# Patient Record
Sex: Male | Born: 1955 | Race: White | Hispanic: No | Smoking: Former smoker
Health system: Southern US, Community
[De-identification: ages and names within clinical notes are randomized; demographics above are authoritative.]

## PROBLEM LIST (undated history)

## (undated) DIAGNOSIS — K7460 Unspecified cirrhosis of liver: Secondary | ICD-10-CM

## (undated) DIAGNOSIS — N19 Unspecified kidney failure: Secondary | ICD-10-CM

## (undated) HISTORY — DX: Unspecified cirrhosis of liver: K74.60

## (undated) HISTORY — DX: Unspecified kidney failure: N19

---

## 2012-11-12 ENCOUNTER — Telehealth: Payer: Self-pay

## 2012-11-12 NOTE — Telephone Encounter (Addendum)
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 He has alcoholic cirrhosis and is moving to [place] from [place]. Can you contact his sister Gigi Gin at [phone] to help coordinate NGI appt with me. Also can you ask about previous records and try to start gettting them sent here. Thanks        I spoke with the pt's sister and scheduled appt for 12/14/12, she asked if he can be seen sooner.  Do you want me to double book?

## 2012-11-13 NOTE — Telephone Encounter (Signed)
Peggy called back. I gave her appointment date and time Peggy verbalized understanding and will have patient here

## 2012-11-13 NOTE — Telephone Encounter (Signed)
Dr. Christella Hartigan had an opening on 11-20-2012 @ 9 am---Moved patient to that spot  LM on Peggy's VM for call back to the office

## 2012-11-13 NOTE — Telephone Encounter (Signed)
Yes, double book is ok,  thanks

## 2012-11-20 ENCOUNTER — Other Ambulatory Visit (INDEPENDENT_AMBULATORY_CARE_PROVIDER_SITE_OTHER): Payer: Self-pay

## 2012-11-20 ENCOUNTER — Encounter: Payer: Self-pay | Admitting: Gastroenterology

## 2012-11-20 ENCOUNTER — Ambulatory Visit (INDEPENDENT_AMBULATORY_CARE_PROVIDER_SITE_OTHER): Payer: Self-pay | Admitting: Gastroenterology

## 2012-11-20 VITALS — BP 88/66 | HR 60 | Ht 71.0 in | Wt 148.6 lb

## 2012-11-20 DIAGNOSIS — R188 Other ascites: Secondary | ICD-10-CM

## 2012-11-20 DIAGNOSIS — K7030 Alcoholic cirrhosis of liver without ascites: Secondary | ICD-10-CM

## 2012-11-20 LAB — COMPREHENSIVE METABOLIC PANEL
AST: 47 U/L — ABNORMAL HIGH (ref 0–37)
Alkaline Phosphatase: 64 U/L (ref 39–117)
BUN: 69 mg/dL — ABNORMAL HIGH (ref 6–23)
CO2: 17 mEq/L — ABNORMAL LOW (ref 19–32)
Chloride: 94 mEq/L — ABNORMAL LOW (ref 96–112)
GFR: 17.15 mL/min — ABNORMAL LOW (ref >60.00)
Glucose, Bld: 82 mg/dL (ref 70–99)
Total Bilirubin: 0.9 mg/dL (ref 0.3–1.2)
Total Protein: 6.4 g/dL (ref 6.0–8.3)

## 2012-11-20 LAB — CBC WITH DIFFERENTIAL/PLATELET
Basophils Relative: 0.4 % (ref 0.0–3.0)
Eosinophils Relative: 2.5 % (ref 0.0–5.0)
HCT: 32.3 % — ABNORMAL LOW (ref 39.0–52.0)
Hemoglobin: 10.9 g/dL — ABNORMAL LOW (ref 13.0–17.0)
Lymphs Abs: 0.7 10*3/uL (ref 0.7–4.0)
MCV: 93.3 fl (ref 78.0–100.0)
Monocytes Absolute: 0.6 10*3/uL (ref 0.1–1.0)
Monocytes Relative: 7.2 % (ref 3.0–12.0)
Neutro Abs: 6.3 10*3/uL (ref 1.4–7.7)
RBC: 3.47 Mil/uL — ABNORMAL LOW (ref 4.22–5.81)
WBC: 7.7 10*3/uL (ref 4.5–10.5)

## 2012-11-20 LAB — PROTIME-INR
INR: 1.1 ratio — ABNORMAL HIGH (ref 0.8–1.0)
Prothrombin Time: 11.4 s (ref 10.2–12.4)

## 2012-11-20 LAB — HEPATITIS B SURFACE ANTIGEN: Hepatitis B Surface Ag: NEGATIVE

## 2012-11-20 LAB — HEPATITIS C ANTIBODY: HCV Ab: NEGATIVE

## 2012-11-20 LAB — FERRITIN: Ferritin: 775.6 ng/mL — ABNORMAL HIGH (ref 22.0–322.0)

## 2012-11-20 LAB — IBC PANEL: Saturation Ratios: 14.3 % — ABNORMAL LOW (ref 20.0–50.0)

## 2012-11-20 NOTE — Patient Instructions (Addendum)
You will have labs checked today in the basement lab.  Please head down after you check out with the front desk  (cmet, cbc, inr, AFP, Hepatitis A (IgM and IgG), Hepatitis B surface antigen, Hepatitis B surface antibody, Hepatitis C antibody, total iron, ferritin, TIBC, ANA, AMA). You need to begin to apply for medicaid, obamacare etc.  We will help point you in right direction for that but the work will mainly be yours. You need to get in touch with [facility] to help with financial aid, guidance. Large volume paracentesis with same time albumin infusion (ultrasound guided), 50 gm of albumin.  Do not remove more than 7-8 liters. Fluid needs to be sent for cell count, albumin, total protein, cytology, gram stain, culture).  Please arrive at [facility] on 11/23/12 at 215 pm.   I will probably be increasing your diuretic pills, will decide this after seeing you lab results. It is important that you have a relatively low salt diet.  High salt diet can cause fluid to accumulate in your legs, abdomen and even around your lungs. You should try to avoid NSAID type over the counter pain medicines as best as possible. Tylenol is safe to take for 'routine' aches and pains, but never take more than 1/2 the dose suggested on the package instructions (never more than 2 grams per day). Avoid alcohol. Please return to see Dr. Christella Hartigan in 3-4 weeks, sooner if needed.

## 2012-11-20 NOTE — Progress Notes (Signed)
HPI: This is a   very pleasant 57 year old man whom I am meeting for the first time today.  His sister is Clinical cytogeneticist, who is a Garment/textile technologist at [employer].  Alcoholic for 20-30 years.  His last drink in July 2014.  Not sure if he has been tested for hep viruses.  No hematemesis,  He did have an EGD about a year ago in [place]; was told he had an ulcer.  Was having melenic stools.  He doesn't recall exactly where it was done or who did it. Does not recall any imaging his liver.  HAs been getting paracentesis LVP every 2-3 weeks (has had about 10 of them.  He thinks they usually remove 10-12 liters during the paracentesis.  The last one was about 2 weeks ago.  He is on diuretics.  Never did other illicit drugs.  He brought with him limited records from [facility]. There is one progress note attached which describes the hospital stay this past August. He apparently was presented with unresponsiveness, altered mental status. He required intubation. Total bilirubin was at least 1.7 on one occasion. He underwent a 13 L paracentesis. "Do to overall decline hospice was consulted and he was transferred to hospice"  He has since then recovered obviously somewhat. He moved here and is a resident of a skilled nursing facility now.  Review of systems: Pertinent positive and negative review of systems were noted in the above HPI section. Complete review of systems was performed and was otherwise normal.    Past Medical History  Diagnosis Date  . Cirrhosis     History reviewed. No pertinent past surgical history.  Current Outpatient Prescriptions  Medication Sig Dispense Refill  . furosemide (LASIX) 20 MG tablet Take 20 mg by mouth daily.      . haloperidol (HALDOL) 1 MG tablet Take 1 mg by mouth 4 (four) times daily as needed.      . lactulose, encephalopathy, (CHRONULAC) 10 GM/15ML SOLN Take 10 g by mouth at bedtime and may repeat dose one time if needed.      Marland Kitchen LORazepam (ATIVAN)  0.5 MG tablet Take 0.5 mg by mouth every 8 (eight) hours.      . pentoxifylline (TRENTAL) 400 MG CR tablet Take 400 mg by mouth 3 (three) times daily with meals.      . rifaximin (XIFAXAN) 550 MG TABS tablet Take 550 mg by mouth 2 (two) times daily.      Marland Kitchen spironolactone (ALDACTONE) 100 MG tablet Take 100 mg by mouth daily.      Marland Kitchen thiamine (VITAMIN B-1) 100 MG tablet Take 100 mg by mouth 2 (two) times daily.       No current facility-administered medications for this visit.    Allergies as of 11/20/2012  . (No Known Allergies)    Family History  Problem Relation Age of Onset  . Heart disease Father     History   Social History  . Marital Status: Single    Spouse Name: N/A    Number of Children: N/A  . Years of Education: N/A   Occupational History  . Unemployed    Social History Main Topics  . Smoking status: Former Smoker    Types: Cigars  . Smokeless tobacco: Never Used  . Alcohol Use: No  . Drug Use: No  . Sexual Activity: Not on file   Other Topics Concern  . Not on file   Social History Narrative  . No narrative on  file       Physical Exam: BP 88/66  Pulse 60  Ht 5\' 11"  (1.803 m)  Wt 148 lb 9.6 oz (67.405 kg)  BMI 20.73 kg/m2 Constitutional: Chronically ill-appearing Psychiatric: alert and oriented x3 Eyes: extraocular movements intact, anicteric sclera Mouth: oral pharynx moist, no lesions Neck: supple no lymphadenopathy Cardiovascular: heart regular rate and rhythm Lungs: clear to auscultation bilaterally Abdomen: soft, nontender, nondistended, massive ascites, no peritoneal signs, normal bowel sounds Extremities: no lower extremity edema bilaterally, very thin legs Skin: no lesions on visible extremities    Assessment and plan: 57 y.o. male with  cirrhosis, poorly compensated  He is alcoholic, his last drink was about 3 months ago. I suspect that is the etiology of his cirrhosis however like to run some other basic workup check for viral  causes, autoimmune process that could be at into his issue. He will also be immunized for hepatitis A and B. if indicated. His biggest problem right now is that of massive ascites. He has been undergoing periodic paracentesis in [place]. His diuretic dosing is at fairly low dose and I think there is certainly room to increase his diuretics. He is going to get a battery of blood tests today including those mentioned above as well as CBC, complete metabolic profile, alpha-fetoprotein, INR. I will base the increase of his diuretic dosing was on his kidney function and electrolyte status. My hope would be to get him high enough on diuretics that he is not require periodic paracentesis anymore but I am also going to get him set up for a large-volume paracentesis with albumin infusion at the same time, 50 g. He has no health insurance. We have given him paperwork on contacting [facility] financial aid. I made it very clear to him that he will almost certainly require liver transplantation for medium and long term survival and he will need some sort of insurance for that either Medicaid or otherwise. He will return to see me in 3-4 weeks and sooner if needed, I will be in contact with him prior to then certainly.

## 2012-11-22 LAB — HEPATITIS A ANTIBODY, TOTAL: Hep A Total Ab: NEGATIVE

## 2012-11-23 ENCOUNTER — Ambulatory Visit (HOSPITAL_COMMUNITY): Payer: Self-pay

## 2012-11-23 ENCOUNTER — Telehealth: Payer: Self-pay | Admitting: Gastroenterology

## 2012-11-23 ENCOUNTER — Ambulatory Visit (HOSPITAL_COMMUNITY)
Admission: RE | Admit: 2012-11-23 | Discharge: 2012-11-23 | Disposition: A | Discharge status: Home / Self Care | Payer: Self-pay | Source: Ambulatory Visit | Attending: Gastroenterology | Admitting: Gastroenterology

## 2012-11-23 DIAGNOSIS — R188 Other ascites: Secondary | ICD-10-CM | POA: Insufficient documentation

## 2012-11-23 DIAGNOSIS — K7030 Alcoholic cirrhosis of liver without ascites: Secondary | ICD-10-CM

## 2012-11-23 LAB — PROTEIN, BODY FLUID: Total protein, fluid: 0.9 g/dL

## 2012-11-23 LAB — BODY FLUID CELL COUNT WITH DIFFERENTIAL: Total Nucleated Cell Count, Fluid: UNDETERMINED cu mm (ref 0–1000)

## 2012-11-23 LAB — ANA: Anti Nuclear Antibody(ANA): NEGATIVE

## 2012-11-23 LAB — ALBUMIN, FLUID (OTHER)

## 2012-11-23 LAB — MITOCHONDRIAL ANTIBODIES: Mitochondrial M2 Ab, IgG: 0.82 (ref <0.91)

## 2012-11-23 MED ORDER — ALBUMIN HUMAN 25 % IV SOLN
50.0000 g | Freq: Once | INTRAVENOUS | Status: AC
  Administered 2012-11-23: 50 g via INTRAVENOUS
  Filled 2012-11-23: qty 200

## 2012-11-23 NOTE — Procedures (Signed)
Successful US guided paracentesis from RLQ.  Yielded 8L of clear pale yellow fluid.  No immediate complications.  Pt tolerated well.   Specimen was sent for labs. The patient received 50g IV albumin during the procedure as ordered.  Brayton El PA-C 11/23/2012 4:08 PM

## 2012-11-24 ENCOUNTER — Telehealth: Payer: Self-pay | Admitting: Gastroenterology

## 2012-11-24 DIAGNOSIS — K767 Hepatorenal syndrome: Secondary | ICD-10-CM

## 2012-11-24 DIAGNOSIS — K7030 Alcoholic cirrhosis of liver without ascites: Secondary | ICD-10-CM

## 2012-11-24 NOTE — Telephone Encounter (Signed)
Left message on machine to call back  

## 2012-11-24 NOTE — Telephone Encounter (Signed)
Per verbal order from Dr Christella Hartigan pt can not have an order to self administer meds The patient has been notified of this information and all questions answered.

## 2012-11-24 NOTE — Telephone Encounter (Signed)
Tyler Henry, with his Cr so high (3.9) he needs to stop both aldactone and lasix starting today. They clearly aren't working very well anyway given his need for LVP every 2-3 weeks.

## 2012-11-25 NOTE — Telephone Encounter (Signed)
[phone] Message left for [facility] to set up appt Pt to get labs and rov has been scheduled [phone] [facility] needs order to stop aldactone and lasix message left to have her return my call

## 2012-11-25 NOTE — Telephone Encounter (Signed)
Tyler Henry returned call and asked for a faxed order to [phone].  I have faxed the results and the order to stop the aldactone and the lasix

## 2012-11-25 NOTE — Telephone Encounter (Addendum)
Tyler Henry, Please call him. His kidney function is very poor (Cr 3.9) and so we cannot increase his diuretics at all. His T bili is 0.9 and INR 1.1 with normal platelets however. He had LVP yesterday and needs another set of labs on Thursday cmet. 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 He should have ROV with me in next 2-3 weeks. Considering TIPS however he was encephalopathic in [place] to the point of requiring intubation.

## 2012-11-26 ENCOUNTER — Telehealth: Payer: Self-pay

## 2012-11-26 ENCOUNTER — Other Ambulatory Visit (INDEPENDENT_AMBULATORY_CARE_PROVIDER_SITE_OTHER): Payer: Self-pay

## 2012-11-26 DIAGNOSIS — K7030 Alcoholic cirrhosis of liver without ascites: Secondary | ICD-10-CM

## 2012-11-26 LAB — COMPREHENSIVE METABOLIC PANEL
AST: 37 U/L (ref 0–37)
Albumin: 3.7 g/dL (ref 3.5–5.2)
Alkaline Phosphatase: 57 U/L (ref 39–117)
BUN: 50 mg/dL — ABNORMAL HIGH (ref 6–23)
CO2: 19 mEq/L (ref 19–32)
Creatinine, Ser: 2.9 mg/dL — ABNORMAL HIGH (ref 0.4–1.5)
GFR: 23.81 mL/min — ABNORMAL LOW (ref >60.00)
Glucose, Bld: 102 mg/dL — ABNORMAL HIGH (ref 70–99)
Sodium: 128 mEq/L — ABNORMAL LOW (ref 135–145)
Total Bilirubin: 1.3 mg/dL — ABNORMAL HIGH (ref 0.3–1.2)
Total Protein: 6.7 g/dL (ref 6.0–8.3)

## 2012-11-26 NOTE — Telephone Encounter (Signed)
rov with me next week also (double book if needed)  Left message on machine to call back

## 2012-11-27 LAB — BODY FLUID CULTURE
Culture: NO GROWTH
Gram Stain: NONE SEEN

## 2012-11-27 NOTE — Telephone Encounter (Signed)
Left message on machine to call back  

## 2012-11-30 NOTE — Telephone Encounter (Signed)
Pt sister has been contacted and appt has been made, she will call back if she wants the pt to have labs drawn wed morning here instead of [address] [place]

## 2012-12-01 ENCOUNTER — Telehealth: Payer: Self-pay | Admitting: Gastroenterology

## 2012-12-01 NOTE — Telephone Encounter (Signed)
Pt notified he can come in tomorrow for labs prior to appt

## 2012-12-02 ENCOUNTER — Other Ambulatory Visit (INDEPENDENT_AMBULATORY_CARE_PROVIDER_SITE_OTHER): Payer: Self-pay

## 2012-12-02 ENCOUNTER — Ambulatory Visit (INDEPENDENT_AMBULATORY_CARE_PROVIDER_SITE_OTHER): Payer: Self-pay | Admitting: Gastroenterology

## 2012-12-02 ENCOUNTER — Encounter: Payer: Self-pay | Admitting: Gastroenterology

## 2012-12-02 ENCOUNTER — Other Ambulatory Visit: Payer: Self-pay

## 2012-12-02 VITALS — BP 96/70 | HR 104 | Ht 68.0 in | Wt 147.2 lb

## 2012-12-02 DIAGNOSIS — K7460 Unspecified cirrhosis of liver: Secondary | ICD-10-CM

## 2012-12-02 DIAGNOSIS — Z23 Encounter for immunization: Secondary | ICD-10-CM

## 2012-12-02 LAB — COMPREHENSIVE METABOLIC PANEL
Albumin: 3.4 g/dL — ABNORMAL LOW (ref 3.5–5.2)
Alkaline Phosphatase: 73 U/L (ref 39–117)
CO2: 21 mEq/L (ref 19–32)
Creatinine, Ser: 3.6 mg/dL — ABNORMAL HIGH (ref 0.4–1.5)
GFR: 18.46 mL/min — ABNORMAL LOW (ref >60.00)
Glucose, Bld: 138 mg/dL — ABNORMAL HIGH (ref 70–99)
Potassium: 4.5 mEq/L (ref 3.5–5.1)
Sodium: 127 mEq/L — ABNORMAL LOW (ref 135–145)
Total Protein: 6.7 g/dL (ref 6.0–8.3)

## 2012-12-02 NOTE — Patient Instructions (Addendum)
We will set up large volume paracentesis in 1 week (9 liters max, 50gm albumin during LVP). Send fluid for cell count and differential.  Please arrive at 1045 am at [facility] on 12/08/12.  There is no prep for this procedure. Awaiting labs today. You need to work on financial assistance through [employer]. We can help with setting up that appt. The medium, long term goal is evaluation at transplant center. Please return to see Dr. Christella Hartigan in 2 weeks.  Will discuss EGD, screening for varices at that point. Call back if you are interested in antinausea meds (zofran bid). Flu shot today.

## 2012-12-02 NOTE — Progress Notes (Signed)
HPI: This is a   pleasant 57 year old man whom I last saw about 2 weeks ago. He is here with his sister today who came in the room with [place].  Last week LVP (8 liters Volume removed).  Abd felt much better afterwards.  High saag.  Has not had renal appt yet.  Cr 3.9, down to 2.9.  He had labs drawn this morning but those are not back yet.    Past Medical History  Diagnosis Date  . Cirrhosis     History reviewed. No pertinent past surgical history.  Current Outpatient Prescriptions  Medication Sig Dispense Refill  . haloperidol (HALDOL) 1 MG tablet Take 1 mg by mouth 4 (four) times daily as needed.      . lactulose, encephalopathy, (CHRONULAC) 10 GM/15ML SOLN Take 10 g by mouth at bedtime and may repeat dose one time if needed.      Marland Kitchen LORazepam (ATIVAN) 0.5 MG tablet Take 0.5 mg by mouth every 8 (eight) hours.      . pentoxifylline (TRENTAL) 400 MG CR tablet Take 400 mg by mouth 3 (three) times daily with meals.      . rifaximin (XIFAXAN) 550 MG TABS tablet Take 550 mg by mouth 2 (two) times daily.      Marland Kitchen thiamine (VITAMIN B-1) 100 MG tablet Take 100 mg by mouth 2 (two) times daily.       No current facility-administered medications for this visit.    Allergies as of 12/02/2012  . (No Known Allergies)    Family History  Problem Relation Age of Onset  . Heart disease Father     History   Social History  . Marital Status: Divorced    Spouse Name: N/A    Number of Children: N/A  . Years of Education: N/A   Occupational History  . Unemployed    Social History Main Topics  . Smoking status: Former Smoker    Types: Cigars  . Smokeless tobacco: Never Used  . Alcohol Use: No  . Drug Use: No  . Sexual Activity: Not on file   Other Topics Concern  . Not on file   Social History Narrative  . No narrative on file      Physical Exam: BP 96/70  Pulse 104  Ht 5\' 8"  (1.727 m)  Wt 147 lb 4 oz (66.792 kg)  BMI 22.39 kg/m2 Constitutional: generally  well-appearing Psychiatric: alert and oriented x3 Abdomen: soft, nontender, nondistended, large ascites , no peritoneal signs, normal bowel sounds  no lower extremity edema    Assessment and plan: 57 y.o. male with  cirrhosis, decompensated  His diuretics were stopped over this week. Creatinine was 3.9 and then recheck 2.9. I'm waiting on labs today. He has not met with the kidney doctor that we have initiated that referral. He has not done any legwork needed to meet with financial and 8, Medicaid assistance et Karie Soda. He is not really aware of the meds that he is getting at his extended care facility. He was considering moving up to [place]. I explained to him that he has renal failure, cirrhosis is decompensated. He is a very tenuous position right now and his health is spiral out of control very easily. I think relocating again is not a good idea for him. We'll set up large volume paracentesis for him in one week. He'll return seen in 2 weeks. He and his sister going to work on [employer] assistance, Environmental consultant. The goal is going  to be eventual liver transplant evaluation. His last drink was "3 or 45 months ago".

## 2012-12-03 ENCOUNTER — Other Ambulatory Visit: Payer: Self-pay

## 2012-12-03 DIAGNOSIS — K7460 Unspecified cirrhosis of liver: Secondary | ICD-10-CM

## 2012-12-03 MED ORDER — MIDODRINE HCL 5 MG PO TABS: 5.0000 mg | ORAL_TABLET | Freq: Three times a day (TID) | ORAL | Status: DC

## 2012-12-08 ENCOUNTER — Ambulatory Visit (HOSPITAL_COMMUNITY)
Admission: RE | Admit: 2012-12-08 | Discharge: 2012-12-08 | Disposition: A | Discharge status: Home / Self Care | Payer: Self-pay | Source: Ambulatory Visit | Attending: Gastroenterology | Admitting: Gastroenterology

## 2012-12-08 VITALS — BP 94/62 | HR 80

## 2012-12-08 DIAGNOSIS — R188 Other ascites: Secondary | ICD-10-CM | POA: Insufficient documentation

## 2012-12-08 DIAGNOSIS — K7460 Unspecified cirrhosis of liver: Secondary | ICD-10-CM

## 2012-12-08 LAB — BODY FLUID CELL COUNT WITH DIFFERENTIAL
Monocyte-Macrophage-Serous Fluid: 51 % (ref 50–90)
Neutrophil Count, Fluid: 7 % (ref 0–25)
Total Nucleated Cell Count, Fluid: 38 cu mm (ref 0–1000)

## 2012-12-08 MED ORDER — ALBUMIN HUMAN 25 % IV SOLN
50.0000 g | Freq: Once | INTRAVENOUS | Status: AC
  Administered 2012-12-08: 50 g via INTRAVENOUS
  Filled 2012-12-08: qty 200

## 2012-12-08 NOTE — Procedures (Signed)
US guided diagnostic/therapeutic paracentesis performed yielding 9 liters (maximum ordered) yellow fluid. No immediate complications. The pt received IV albumin infusion during procedure.

## 2012-12-09 ENCOUNTER — Other Ambulatory Visit (INDEPENDENT_AMBULATORY_CARE_PROVIDER_SITE_OTHER): Payer: Self-pay

## 2012-12-09 DIAGNOSIS — K7460 Unspecified cirrhosis of liver: Secondary | ICD-10-CM

## 2012-12-09 LAB — BASIC METABOLIC PANEL
CO2: 18 mEq/L — ABNORMAL LOW (ref 19–32)
GFR: 26.74 mL/min — ABNORMAL LOW (ref >60.00)
Glucose, Bld: 140 mg/dL — ABNORMAL HIGH (ref 70–99)
Potassium: 4.1 mEq/L (ref 3.5–5.1)
Sodium: 129 mEq/L — ABNORMAL LOW (ref 135–145)

## 2012-12-14 ENCOUNTER — Other Ambulatory Visit: Payer: Self-pay

## 2012-12-14 ENCOUNTER — Ambulatory Visit: Payer: Self-pay | Admitting: Gastroenterology

## 2012-12-14 DIAGNOSIS — K7460 Unspecified cirrhosis of liver: Secondary | ICD-10-CM

## 2012-12-18 ENCOUNTER — Other Ambulatory Visit (INDEPENDENT_AMBULATORY_CARE_PROVIDER_SITE_OTHER): Payer: Self-pay

## 2012-12-18 ENCOUNTER — Telehealth: Payer: Self-pay

## 2012-12-18 DIAGNOSIS — K7460 Unspecified cirrhosis of liver: Secondary | ICD-10-CM

## 2012-12-18 LAB — BASIC METABOLIC PANEL
CO2: 16 mEq/L — ABNORMAL LOW (ref 19–32)
Chloride: 102 mEq/L (ref 96–112)
Creatinine, Ser: 1.4 mg/dL (ref 0.4–1.5)
Glucose, Bld: 86 mg/dL (ref 70–99)
Potassium: 4 mEq/L (ref 3.5–5.1)

## 2012-12-18 NOTE — Telephone Encounter (Signed)
Message copied by Donata Duff on Fri Dec 18, 2012  8:14 AM ------      Message from: Donata Duff      Created: Mon Dec 14, 2012  3:17 PM       Pt to get labs  ------

## 2012-12-18 NOTE — Telephone Encounter (Signed)
The patient has been notified and will come in for labs

## 2012-12-21 ENCOUNTER — Telehealth: Payer: Self-pay | Admitting: Gastroenterology

## 2012-12-21 NOTE — Telephone Encounter (Signed)
Pt will discuss with Dr Christella Hartigan at his office appt tomorrow

## 2012-12-22 ENCOUNTER — Encounter: Payer: Self-pay | Admitting: Gastroenterology

## 2012-12-22 ENCOUNTER — Ambulatory Visit (INDEPENDENT_AMBULATORY_CARE_PROVIDER_SITE_OTHER): Payer: Self-pay | Admitting: Gastroenterology

## 2012-12-22 VITALS — BP 108/64 | HR 64 | Ht 68.0 in | Wt 157.0 lb

## 2012-12-22 DIAGNOSIS — Z23 Encounter for immunization: Secondary | ICD-10-CM

## 2012-12-22 DIAGNOSIS — K7460 Unspecified cirrhosis of liver: Secondary | ICD-10-CM

## 2012-12-22 MED ORDER — LACTULOSE ENCEPHALOPATHY 10 GM/15ML PO SOLN: 20.0000 g | Freq: Two times a day (BID) | ORAL | Status: DC

## 2012-12-22 MED ORDER — HYDROXYZINE HCL 10 MG PO TABS: 10.0000 mg | ORAL_TABLET | Freq: Three times a day (TID) | ORAL | Status: DC | PRN

## 2012-12-22 NOTE — Patient Instructions (Addendum)
Will start immunization for Hep A, B today. We have given you your appointments for your follow up injections. Will set up LVP, 9 liters, 50 grams of Iv albumin. We will set you up for every three weeks.   Labs this Friday, BMET. We will get records from your nephrologist visit last week.  You should continue to follow up with him as he recommended. You need to sit down with [facility] to work on some type of payment schedule. You will need medicaid to even be considered of liver transplant. Please return to see Dr. Christella Hartigan in 3-4 weeks. Stop the xifaxan. Restart lactulose 30ml twice daily. New prescription was called in for you. Hydroxyzine called into your pharmacy for your psoriasis, you need to set up an appt with PCP. We can help with this if you need. Call sooner when you feel you are in need of paracentesis and we can order it for you.

## 2012-12-22 NOTE — Progress Notes (Signed)
Review of pertinent gastrointestinal problems: 1. etoh related cirrhosis:  Quit drinking August 2014, was alcoholic previously.  Labs 11/2012 showed INR 1.1, normal platelets, Hep A neg, Hep B S Ag and Ab negative, ANA neg, iron testing normal, Hep C Ab negative. Sister, Gigi Gin is Scientist, clinical (histocompatibility and immunogenetics) at [employer].   Complicated by large volume ascites, diuretic resistant (lasix and aldactone at medium doses failed to control ascites and also led to significant rise in CR: Cr as high as 3.6 in 11/2012.  Has required periodic LVP (about every 2-3 weeks), 8-9 liters in [place].  Fluid testing 11/2012 showed NO SBP, elevated SAAG, cytology negative for malignancy.   Most recent imaging of liver: during LVPs only  Most recent AFP: 11/2012 normal;  Most recent EGD: none yet.  H/o encephalopathy 11/2012, [place], was unresponsive  August 2014 [facility] [place] records: There is one progress note attached which describes the hospital stay August 2014. He apparently was presented with unresponsiveness, altered mental status. He required intubation. Total bilirubin was at least 1.7 on one occasion. He underwent a 13 L paracentesis. "Do to overall decline hospice was consulted and he was transferred to hospice." Family brought him up to  to live in [facility] 11/2012.  HPI: This is a  pleasant 57 year old man whom I last saw  Weight is 10 pounds up from visit 2 weeks ago.  He was seen by nephrologist last week.  Tells me he painted a grim picture.  HE is hesistant to go back to him.  CR last Friday was 1.4, the lowest since he's been in [place].  He did not sit down with [employer] [facility].  Has not been to PCP.  Pretty tight with ascites again  No fevers or chills.  Has been thinking clearly. 2-3 weeks ago.  No overt GI bleeding.     Past Medical History  Diagnosis Date  . Cirrhosis   . Kidney failure     History reviewed. No pertinent past surgical history.  Current Outpatient Prescriptions  Medication  Sig Dispense Refill  . haloperidol (HALDOL) 1 MG tablet Take 1 mg by mouth 4 (four) times daily as needed.      . lactulose, encephalopathy, (CHRONULAC) 10 GM/15ML SOLN Take 10 g by mouth at bedtime and may repeat dose one time if needed.      Marland Kitchen LORazepam (ATIVAN) 0.5 MG tablet Take 0.5 mg by mouth every 8 (eight) hours.      . midodrine (PROAMATINE) 5 MG tablet Take 1 tablet (5 mg total) by mouth 3 (three) times daily.  90 tablet  11  . pentoxifylline (TRENTAL) 400 MG CR tablet Take 400 mg by mouth 3 (three) times daily with meals.      . rifaximin (XIFAXAN) 550 MG TABS tablet Take 550 mg by mouth 2 (two) times daily.      Marland Kitchen thiamine (VITAMIN B-1) 100 MG tablet Take 100 mg by mouth 2 (two) times daily.       No current facility-administered medications for this visit.    Allergies as of 12/22/2012  . (No Known Allergies)    Family History  Problem Relation Age of Onset  . Heart disease Father     History   Social History  . Marital Status: Divorced    Spouse Name: N/A    Number of Children: N/A  . Years of Education: N/A   Occupational History  . Unemployed    Social History Main Topics  . Smoking status: Former Smoker  Types: Cigars    Quit date: 12/23/2007  . Smokeless tobacco: Never Used  . Alcohol Use: No  . Drug Use: No  . Sexual Activity: Not on file   Other Topics Concern  . Not on file   Social History Narrative  . No narrative on file      Physical Exam: BP 108/64  Pulse 64  Ht 5\' 8"  (1.727 m)  Wt 157 lb (71.215 kg)  BMI 23.88 kg/m2 Constitutional: Chronically ill-appearing Psychiatric: alert and oriented x3 Abdomen: soft, nontender, nondistended, ++ obvious ascites, no peritoneal signs, normal bowel sounds No lower extremity edema    Assessment and plan: 57 y.o. male with cirrhosis, portal hypertension, ascites  First, he has not taken any initiative to get himself Medicaid or go via [facility] financial aid office. Explained to him very  clearly again that without some type of financial plan he is not going to be position and undergoing transplant evaluation reconsider transplant. Second he was not happy with his nephrology visit saying that the physician painted to bring a picture. He is not sure that he wants to go back her back tried to to get him to understand that renal involvement will be daily. Certainly his creatinine is better late last week and hopefully will stay that way but I will not be able to manage his renal function. He asked me to prescribe him psoriasis medicine which I'm going to do I told him he will need to get set up with her primary care physician in the area to help with other primary care, non-cirrhosis related issues. Seems unmotivated to do that. He is clearly overburdened with ascites again we will get a lot of another large-volume paracentesis set up for him and then I work on having him get a scheduled appointment for large-volume paracentesis every 2-3 weeks with radiology. You have a basic set of labs on Friday. He is on Xifaxan as well as lactulose for encephalopathy however he stop taking the lactulose recently. He was given these when he was seriously encephalopathic this past August and [place]. I'm going to try him on lactulose alone since he is run out of Xifaxan pills.

## 2012-12-23 ENCOUNTER — Telehealth: Payer: Self-pay | Admitting: Gastroenterology

## 2012-12-23 DIAGNOSIS — K7030 Alcoholic cirrhosis of liver without ascites: Secondary | ICD-10-CM

## 2012-12-24 ENCOUNTER — Other Ambulatory Visit: Payer: Self-pay

## 2012-12-24 DIAGNOSIS — K7030 Alcoholic cirrhosis of liver without ascites: Secondary | ICD-10-CM

## 2012-12-24 NOTE — Telephone Encounter (Signed)
Pt appt has been rescheduled and set up for standing order for paracentesis every three weeks 9 liters removed and 50 gram albumin during removal

## 2012-12-25 ENCOUNTER — Other Ambulatory Visit: Payer: Self-pay | Admitting: Radiology

## 2012-12-25 ENCOUNTER — Ambulatory Visit (HOSPITAL_COMMUNITY): Admission: RE | Admit: 2012-12-25 | Payer: Self-pay | Source: Ambulatory Visit

## 2012-12-25 ENCOUNTER — Other Ambulatory Visit: Payer: Self-pay | Admitting: Gastroenterology

## 2012-12-25 ENCOUNTER — Ambulatory Visit (HOSPITAL_COMMUNITY)
Admission: RE | Admit: 2012-12-25 | Discharge: 2012-12-25 | Disposition: A | Discharge status: Home / Self Care | Payer: Self-pay | Source: Ambulatory Visit | Attending: Gastroenterology | Admitting: Gastroenterology

## 2012-12-25 DIAGNOSIS — K7030 Alcoholic cirrhosis of liver without ascites: Secondary | ICD-10-CM

## 2012-12-25 DIAGNOSIS — R188 Other ascites: Secondary | ICD-10-CM | POA: Insufficient documentation

## 2012-12-25 DIAGNOSIS — K7460 Unspecified cirrhosis of liver: Secondary | ICD-10-CM | POA: Insufficient documentation

## 2012-12-25 MED ORDER — ALBUMIN HUMAN 25 % IV SOLN
50.0000 g | Freq: Once | INTRAVENOUS | Status: AC
  Administered 2012-12-25: 50 g via INTRAVENOUS
  Filled 2012-12-25: qty 200

## 2012-12-25 MED ORDER — LIDOCAINE HCL 1 % IJ SOLN
INTRAMUSCULAR | Status: AC
  Filled 2012-12-25: qty 20

## 2012-12-25 NOTE — Procedures (Signed)
Successful US guided paracentesis from RLQ.  Yielded 9L of hazy yellow fluid.  No immediate complications.  Pt tolerated well.   Specimen was not sent for labs. The patient was given 50g IV albumin during the procedure.  Brayton El PA-C 12/25/2012 1:31 PM

## 2012-12-28 ENCOUNTER — Telehealth: Payer: Self-pay | Admitting: Gastroenterology

## 2012-12-28 ENCOUNTER — Other Ambulatory Visit: Payer: Self-pay | Admitting: Gastroenterology

## 2012-12-28 DIAGNOSIS — K7460 Unspecified cirrhosis of liver: Secondary | ICD-10-CM

## 2012-12-28 DIAGNOSIS — K7030 Alcoholic cirrhosis of liver without ascites: Secondary | ICD-10-CM

## 2012-12-28 NOTE — Telephone Encounter (Signed)
Ok, bmet tomorrow AM with rov with me or extender in later morning or early afternoon.  thanks

## 2012-12-28 NOTE — Telephone Encounter (Signed)
Dr Christella Hartigan the had his paracentesis on Friday but thinks he needs another one now, wants to be seen this week, is an office appt needed or is there something else you want to do?

## 2012-12-28 NOTE — Telephone Encounter (Signed)
12/29/12 330 pm appt with Dr Christella Hartigan and labs prior pt is aware

## 2012-12-29 ENCOUNTER — Encounter: Payer: Self-pay | Admitting: Gastroenterology

## 2012-12-29 ENCOUNTER — Other Ambulatory Visit (INDEPENDENT_AMBULATORY_CARE_PROVIDER_SITE_OTHER): Payer: Self-pay

## 2012-12-29 ENCOUNTER — Ambulatory Visit (INDEPENDENT_AMBULATORY_CARE_PROVIDER_SITE_OTHER): Payer: Self-pay | Admitting: Gastroenterology

## 2012-12-29 ENCOUNTER — Other Ambulatory Visit: Payer: Self-pay | Admitting: Gastroenterology

## 2012-12-29 VITALS — BP 98/60 | HR 96 | Ht 68.0 in | Wt 152.0 lb

## 2012-12-29 DIAGNOSIS — R188 Other ascites: Secondary | ICD-10-CM

## 2012-12-29 DIAGNOSIS — K7460 Unspecified cirrhosis of liver: Secondary | ICD-10-CM

## 2012-12-29 DIAGNOSIS — K7030 Alcoholic cirrhosis of liver without ascites: Secondary | ICD-10-CM

## 2012-12-29 LAB — BASIC METABOLIC PANEL
BUN: 33 mg/dL — ABNORMAL HIGH (ref 6–23)
CO2: 17 mEq/L — ABNORMAL LOW (ref 19–32)
Calcium: 8.3 mg/dL — ABNORMAL LOW (ref 8.4–10.5)
Chloride: 103 mEq/L (ref 96–112)
Creatinine, Ser: 1.6 mg/dL — ABNORMAL HIGH (ref 0.4–1.5)
Glucose, Bld: 98 mg/dL (ref 70–99)
Sodium: 130 mEq/L — ABNORMAL LOW (ref 135–145)

## 2012-12-29 NOTE — Patient Instructions (Addendum)
Large volume paracentesis at radiology (9-10 liters) with 50 gm albumin infusion prior, will ask radiology to send for cell count, gram stain and differential.  Please arrive at [facility] on 01/01/13 at 9 am.   Please return to see Dr. Christella Hartigan in 2-3 weeks. Low salt. NO NSAIDs. No extra fluids.

## 2012-12-29 NOTE — Progress Notes (Signed)
Review of pertinent gastrointestinal problems:  1. etoh related cirrhosis: Quit drinking August 2014, was alcoholic previously.  August 2014 [facility] [place] records: There is one progress note attached which describes the hospital stay August 2014. He apparently presented with unresponsiveness, altered mental status. He required intubation. Total bilirubin was at least 1.7 on one occasion. He underwent a 13 L paracentesis. "Do to overall decline hospice was consulted and he was transferred to hospice." Family brought him up to [place] to live in [facility] 11/2012. Labs 11/2012 showed INR 1.1, normal platelets, Hep A neg, Hep B S Ag and Ab negative, ANA neg, iron testing normal, Hep C Ab negative. Sister, Gigi Gin is Scientist, clinical (histocompatibility and immunogenetics) at [employer].  Complicated by large volume ascites, diuretic resistant (lasix and aldactone at medium doses failed to control ascites and also led to significant rise in CR: Cr as high as 3.6 in 11/2012. Has required periodic LVP (about every 2-3 weeks), 8-9 liters in [place]. Fluid testing 11/2012 showed NO SBP, elevated SAAG, cytology negative for malignancy.  Most recent imaging of liver: during LVPs only  Most recent AFP: 11/2012 normal;  Most recent EGD: none yet.  H/o encephalopathy 11/2012, [place], was unresponsive   HPI: This is a   pleasant 57 year old man whom I last saw 1 week ago.  I reviewed notes from nephrology consultation about a week ago, he reiterated need to be on low salt diet and to avoid nsaids.  Felt renal picture was from HRS probably.  Was awaiting [place]  Weighed 157 pounds last week, underwent 9 liter paracentesis 4-5 days ago and today he weighs 152.  Afterwards ate fish fry, and fruit salad.  Then next morning he was having terrible diarrhea and vomiting, lasted for 3-4 hours.  Drank a lot of fluid that afternoon.  The acute diarrhea, vomiting completely resolved.  Had labs done just prior to this office visit, not back yet.  His sister thinks he may be drinking  again.  Pretty rapid reacumulation of ascites.  No unusual discomforts.  No fevers or chills.  Says he is not drinking etoh again.    Past Medical History  Diagnosis Date  . Cirrhosis   . Kidney failure     History reviewed. No pertinent past surgical history.  Current Outpatient Prescriptions  Medication Sig Dispense Refill  . haloperidol (HALDOL) 1 MG tablet Take 1 mg by mouth 4 (four) times daily as needed.      . hydrOXYzine (ATARAX/VISTARIL) 10 MG tablet Take 1 tablet (10 mg total) by mouth 3 (three) times daily as needed.  30 tablet  5  . lactulose, encephalopathy, (CHRONULAC) 10 GM/15ML SOLN Take 30 mLs (20 g total) by mouth 2 (two) times daily.  2700 mL  3  . LORazepam (ATIVAN) 0.5 MG tablet Take 0.5 mg by mouth every 8 (eight) hours.      . midodrine (PROAMATINE) 5 MG tablet Take 1 tablet (5 mg total) by mouth 3 (three) times daily.  90 tablet  11  . pentoxifylline (TRENTAL) 400 MG CR tablet Take 400 mg by mouth 3 (three) times daily with meals.      . thiamine (VITAMIN B-1) 100 MG tablet Take 100 mg by mouth 2 (two) times daily.       No current facility-administered medications for this visit.    Allergies as of 12/29/2012  . (No Known Allergies)    Family History  Problem Relation Age of Onset  . Heart disease Father  History   Social History  . Marital Status: Divorced    Spouse Name: N/A    Number of Children: N/A  . Years of Education: N/A   Occupational History  . Unemployed    Social History Main Topics  . Smoking status: Former Smoker    Types: Cigars    Quit date: 12/23/2007  . Smokeless tobacco: Never Used  . Alcohol Use: No  . Drug Use: No  . Sexual Activity: Not on file   Other Topics Concern  . Not on file   Social History Narrative  . No narrative on file      Physical Exam: BP 98/60  Pulse 96  Ht 5\' 8"  (1.727 m)  Wt 152 lb (68.947 kg)  BMI 23.12 kg/m2 Constitutional: Chronically ill, cachectic except for massive  ascites Psychiatric: alert and oriented x3 Abdomen: soft, nontender, nondistended, massive ascites , no peritoneal signs, normal bowel sounds     Assessment and plan: 57 y.o. male with cirrhosis, massive ascites  His ascites rapidly reaccumulated. He describes significant vomiting, diarrheal illness this past weekend that lasted 4-5 hours. During that he was drinking a lot of extra fluids. He denied alcohol use, I did tell him that restarting alcohol could also explain this type of decompensation. He has already been set up for interval paracentesis every 3 weeks, we are going to have that done for him sooner however given his rapid reaccumulation. He'll take off 9-10 L, I am asking them to send the fluid to check for infection. That could also explain rapid reaccumulation. His kidney function when last tested was creatinine of 1.4, he had labs drawn just prior to this visit and we will see how it looks. I am considering retrying him on diuretics however I will clear this with his nephrologist before doing that. He will return to see me in 2-3 weeks and sooner if needed.

## 2012-12-30 ENCOUNTER — Other Ambulatory Visit (HOSPITAL_COMMUNITY): Payer: Self-pay | Admitting: Gastroenterology

## 2012-12-31 ENCOUNTER — Telehealth: Payer: Self-pay | Admitting: Gastroenterology

## 2012-12-31 NOTE — Telephone Encounter (Signed)
Pt has been notified that he has a refill left on his medication, he will call the pharmacy and let me know if he has any further problems

## 2012-12-31 NOTE — Telephone Encounter (Signed)
Left message on machine to call back pt should have 1 more refill left

## 2013-01-01 ENCOUNTER — Ambulatory Visit (HOSPITAL_COMMUNITY)
Admission: RE | Admit: 2013-01-01 | Discharge: 2013-01-01 | Disposition: A | Discharge status: Home / Self Care | Payer: Self-pay | Source: Ambulatory Visit | Attending: Gastroenterology | Admitting: Gastroenterology

## 2013-01-01 ENCOUNTER — Encounter (HOSPITAL_COMMUNITY): Payer: Self-pay

## 2013-01-01 ENCOUNTER — Other Ambulatory Visit: Payer: Self-pay | Admitting: Nephrology

## 2013-01-01 VITALS — BP 106/76 | HR 98 | Temp 96.6°F | Resp 18

## 2013-01-01 DIAGNOSIS — K7460 Unspecified cirrhosis of liver: Secondary | ICD-10-CM | POA: Insufficient documentation

## 2013-01-01 DIAGNOSIS — N184 Chronic kidney disease, stage 4 (severe): Secondary | ICD-10-CM

## 2013-01-01 DIAGNOSIS — K7030 Alcoholic cirrhosis of liver without ascites: Secondary | ICD-10-CM

## 2013-01-01 DIAGNOSIS — R188 Other ascites: Secondary | ICD-10-CM | POA: Insufficient documentation

## 2013-01-01 LAB — BODY FLUID CELL COUNT WITH DIFFERENTIAL
Lymphs, Fluid: 46 %
Monocyte-Macrophage-Serous Fluid: 54 % (ref 50–90)

## 2013-01-01 MED ORDER — SODIUM CHLORIDE 0.9 % IV SOLN
250.0000 mL | Freq: Once | INTRAVENOUS | Status: AC
  Administered 2013-01-01: 250 mL via INTRAVENOUS

## 2013-01-01 MED ORDER — ALBUMIN HUMAN 25 % IV SOLN
50.0000 g | INTRAVENOUS | Status: DC
  Administered 2013-01-01: 50 g via INTRAVENOUS
  Filled 2013-01-01: qty 200

## 2013-01-04 LAB — BODY FLUID CULTURE: Culture: NO GROWTH

## 2013-01-05 ENCOUNTER — Telehealth: Payer: Self-pay | Admitting: Gastroenterology

## 2013-01-06 ENCOUNTER — Telehealth: Payer: Self-pay | Admitting: Gastroenterology

## 2013-01-06 NOTE — Telephone Encounter (Signed)
See alternate note  

## 2013-01-06 NOTE — Telephone Encounter (Signed)
Call received from [facility] radiology stating that the pt has refused all future appts for paracentesis until he gets his insurance, the pt has also called for refills on medications.  Do you want to refill and advise on paracentesis?

## 2013-01-06 NOTE — Telephone Encounter (Signed)
He has numerous refills of midodrine and atarax already.  I left message on VM for him to have me paged to discuss further.  ? Cancelling his own paracentesis.

## 2013-01-07 ENCOUNTER — Encounter (HOSPITAL_COMMUNITY): Payer: Self-pay

## 2013-01-07 ENCOUNTER — Ambulatory Visit (HOSPITAL_COMMUNITY)
Admission: RE | Admit: 2013-01-07 | Discharge: 2013-01-07 | Disposition: A | Discharge status: Home / Self Care | Payer: Self-pay | Source: Ambulatory Visit | Attending: Gastroenterology | Admitting: Gastroenterology

## 2013-01-07 ENCOUNTER — Telehealth: Payer: Self-pay | Admitting: Gastroenterology

## 2013-01-07 ENCOUNTER — Other Ambulatory Visit: Payer: Self-pay

## 2013-01-07 ENCOUNTER — Telehealth: Payer: Self-pay

## 2013-01-07 ENCOUNTER — Ambulatory Visit (HOSPITAL_COMMUNITY): Payer: Self-pay

## 2013-01-07 VITALS — BP 115/82 | HR 103 | Temp 97.1°F | Resp 19 | Ht 68.0 in | Wt 162.0 lb

## 2013-01-07 VITALS — BP 118/80 | HR 97 | Temp 97.8°F | Resp 18

## 2013-01-07 DIAGNOSIS — K7460 Unspecified cirrhosis of liver: Secondary | ICD-10-CM

## 2013-01-07 DIAGNOSIS — K7030 Alcoholic cirrhosis of liver without ascites: Secondary | ICD-10-CM

## 2013-01-07 DIAGNOSIS — R188 Other ascites: Secondary | ICD-10-CM | POA: Insufficient documentation

## 2013-01-07 MED ORDER — ALBUMIN HUMAN 25 % IV SOLN
50.0000 g | Freq: Once | INTRAVENOUS | Status: AC
  Administered 2013-01-07: 50 g via INTRAVENOUS
  Filled 2013-01-07: qty 200

## 2013-01-07 MED ORDER — SODIUM CHLORIDE 0.9 % IV SOLN
INTRAVENOUS | Status: DC
  Administered 2013-01-07: 250 mL via INTRAVENOUS

## 2013-01-07 MED ORDER — LORAZEPAM 0.5 MG PO TABS: 0.5000 mg | ORAL_TABLET | Freq: Three times a day (TID) | ORAL | Status: DC

## 2013-01-07 MED ORDER — ALBUMIN HUMAN 25 % IV SOLN: 50.0000 g | INTRAVENOUS | Status: DC

## 2013-01-07 MED ORDER — MIDODRINE HCL 5 MG PO TABS: 5.0000 mg | ORAL_TABLET | Freq: Three times a day (TID) | ORAL | Status: DC

## 2013-01-07 NOTE — Telephone Encounter (Signed)
Can you call in new scripts for his midodrine (same dose schedule, 1 month, 3 refills), also ativan 0.5mg  pill, one pill twice daily, disp 60 with no refills.  All to [facility] outpatient pharmacy. The previous scripts were written to his [facility] previously,  Thanks  He needs rov with me before any more labs, orders for paracentesis, meds.  Very confusing past couple days from him, he also told IR that he is moving to [place] tomorrow

## 2013-01-07 NOTE — Procedures (Signed)
US guided therapeutic paracentesis performed yielding 8 liters(maximum ordered) slightly turbid, yellow fluid. No immediate complications. The pt received IV albumin preprocedure.

## 2013-01-07 NOTE — Telephone Encounter (Signed)
Yesterday, we received message from radiology that Tyler Henry was declining all future paracentesis until he had insurance.  I LMOM to discuss this with him. He also requested refills on meds for which he already had refills on.  Today he walked into radiology without an appointment, insisting on paracentesis. He told them that he is leaving for [place].    I have not been able to reach him on phone today.  Not sure what he is planning, I suspect he is drinking again however.  I authorized paracentesis today but I will not do that again unless he is seen in my office to clarify the above.

## 2013-01-07 NOTE — Telephone Encounter (Signed)
Pt walked in at [facility] for paracentesis, I was called for an order and Dr Christella Hartigan gave a verbal to remove 8 liters and give 50 mg albumin.

## 2013-01-07 NOTE — Progress Notes (Signed)
Uneventful infusion of albumin 50Gm  Here prior to paracentesis. Pt transferred to Ultrasound for his paracentsis via w/c accompanied by [place] staff Ritu Saigal

## 2013-01-07 NOTE — Telephone Encounter (Signed)
Dr Christella Hartigan was called and asked to call the pt's sister to discuss refills and appt's that have been cancelled.

## 2013-01-07 NOTE — Telephone Encounter (Signed)
Prescriptions have been filled as requested

## 2013-01-07 NOTE — Telephone Encounter (Signed)
Order was entered and radiology notified

## 2013-01-20 ENCOUNTER — Ambulatory Visit: Payer: Self-pay | Admitting: Gastroenterology

## 2013-01-25 ENCOUNTER — Telehealth: Payer: Self-pay | Admitting: Gastroenterology

## 2013-01-25 NOTE — Telephone Encounter (Signed)
Pt request medication and office note to be faxed to [facility] [phone] Dr Lowella Dandy

## 2013-01-28 ENCOUNTER — Encounter (HOSPITAL_COMMUNITY): Payer: Self-pay

## 2013-02-18 ENCOUNTER — Encounter (HOSPITAL_COMMUNITY): Payer: Self-pay

## 2013-02-19 ENCOUNTER — Encounter (HOSPITAL_COMMUNITY): Admission: RE | Admit: 2013-02-19 | Payer: Self-pay | Source: Ambulatory Visit

## 2013-03-12 ENCOUNTER — Encounter (HOSPITAL_COMMUNITY): Payer: Self-pay

## 2013-03-21 DEATH — deceased

## 2013-04-19 NOTE — Telephone Encounter (Signed)
See Previous Encounter

## 2015-03-04 IMAGING — US US PARACENTESIS
1 series · 5 of 5 positions shown · non-contrast
Comparison: Previous paracentesis

CLINICAL DATA: Chronic cirrhosis with recurrent ascites. Request
therapeutic paracentesis about 9 L.

EXAM:
ULTRASOUND GUIDED PARACENTESIS

[Series 1: us paracentesis · 0.29mm/px · 5 of 5 slices shown]
[im 1/5]
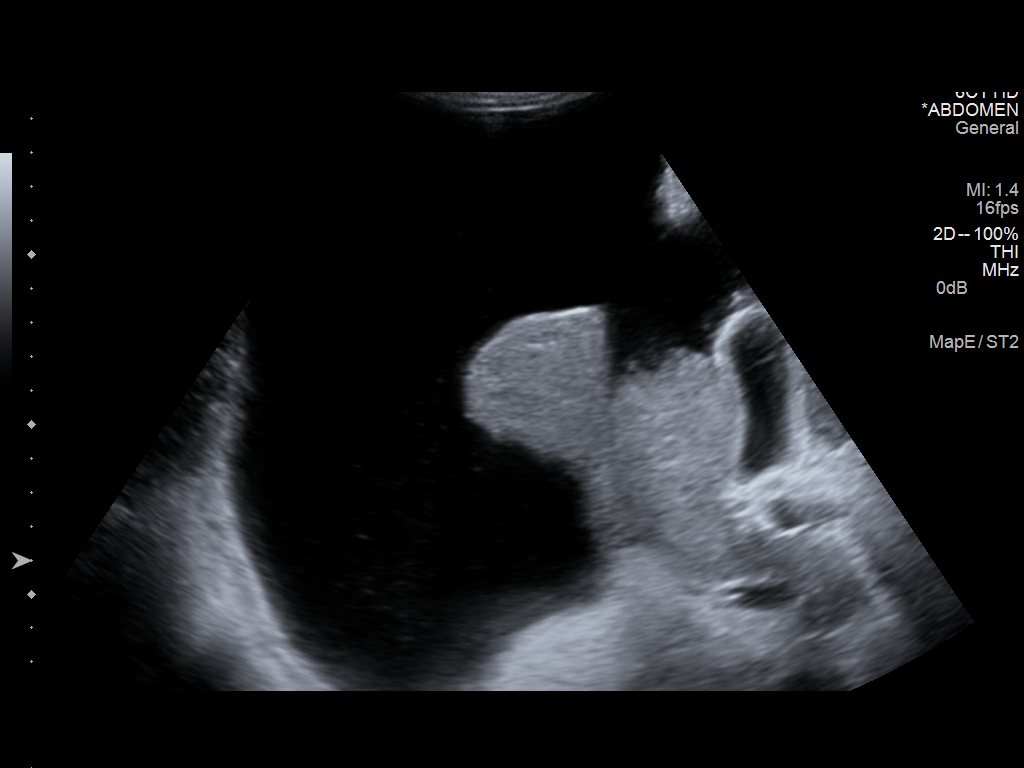
[im 2/5]
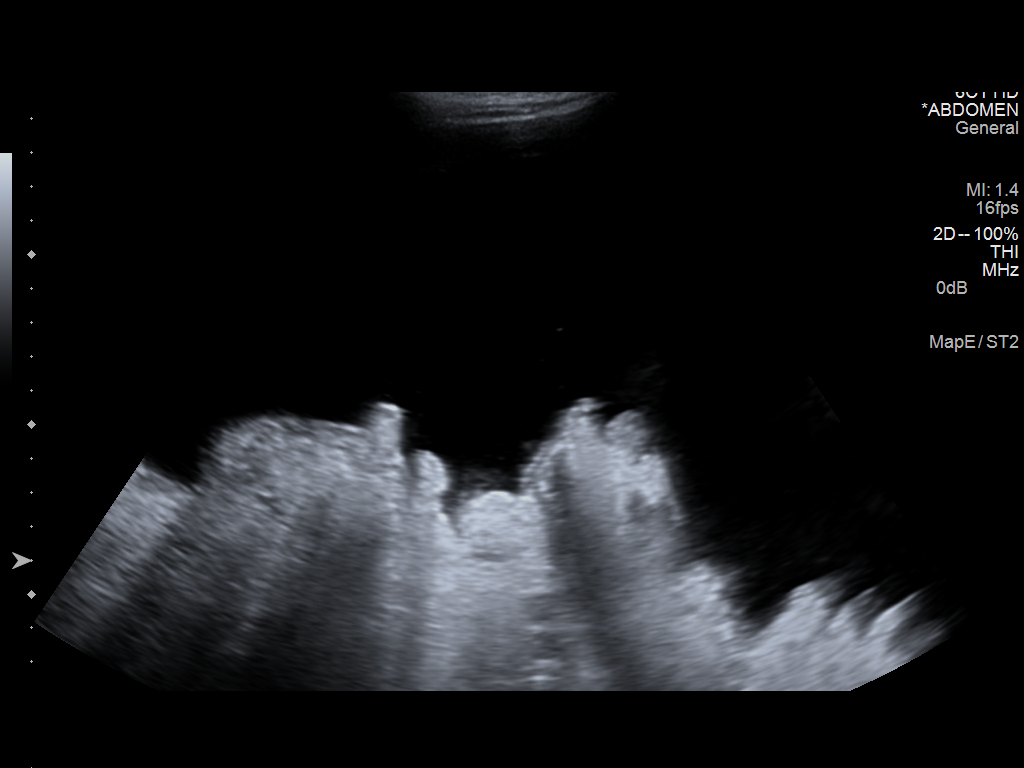
[im 3/5]
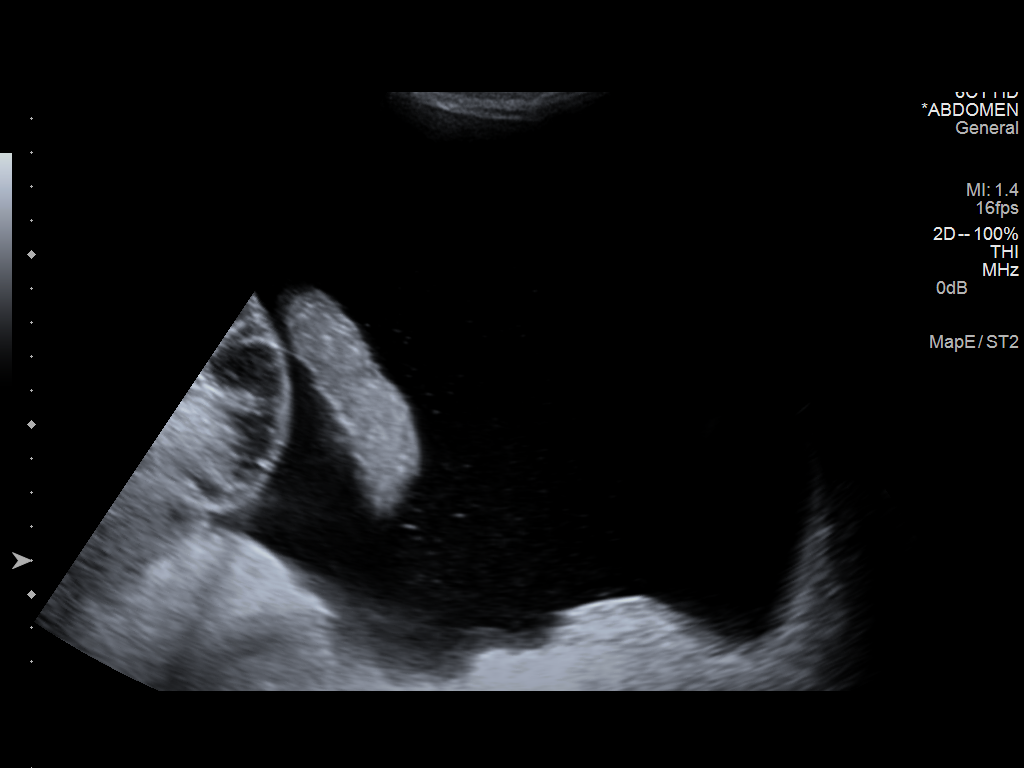
[im 4/5]
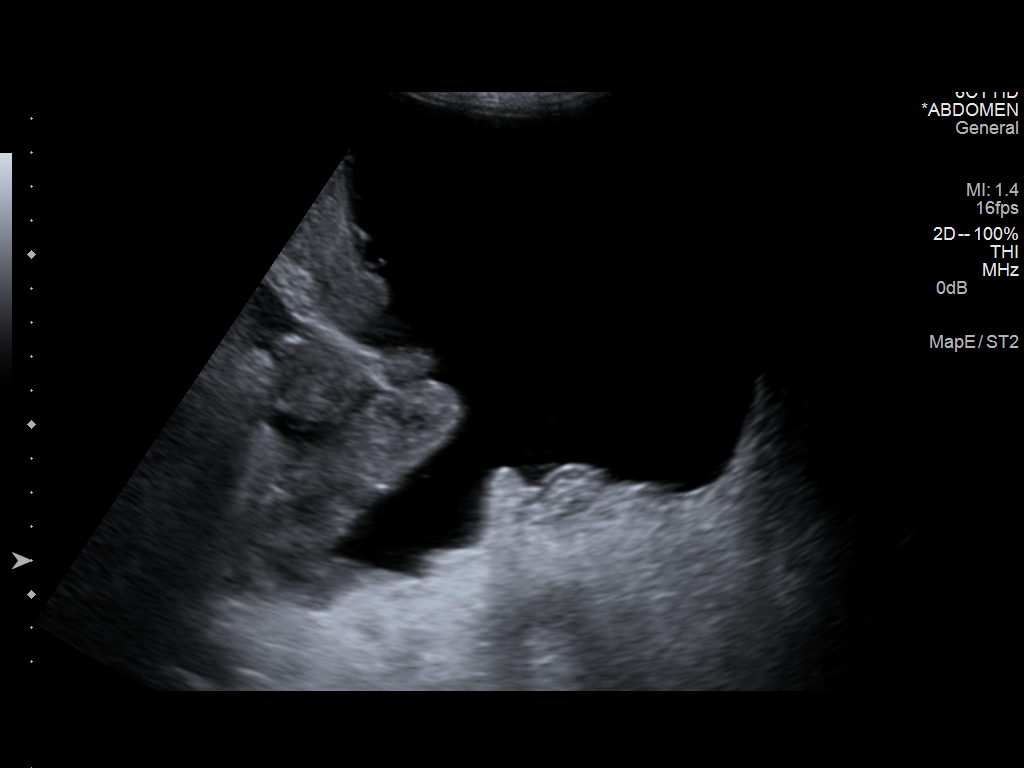
[im 5/5]
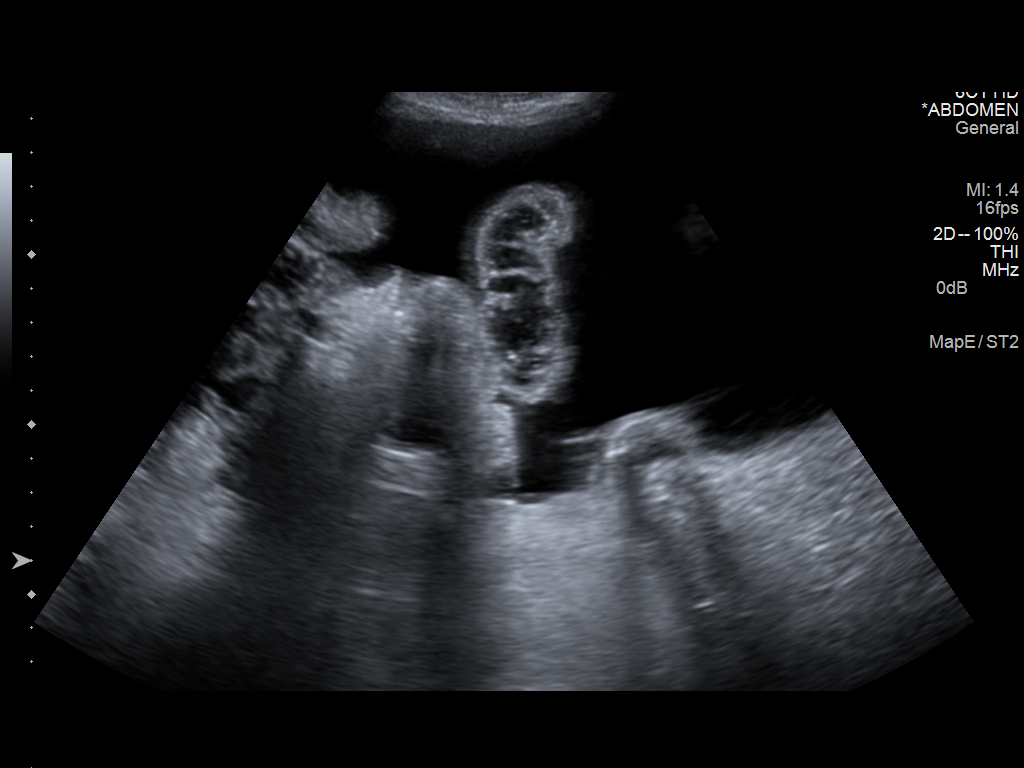

[5 of 5 positions shown; findings below may reference images not displayed]

PROCEDURE:
An ultrasound guided paracentesis was thoroughly discussed with the
patient and questions answered. The benefits, risks, alternatives
and complications were also discussed. The patient understands and
wishes to proceed with the procedure. Written consent was obtained.

Ultrasound was performed to localize and mark an adequate pocket of
fluid in the left lower quadrant of the abdomen. The area was then
prepped and draped in the normal sterile fashion. 1% Lidocaine was
used for local anesthesia. Under ultrasound guidance a 19 gauge Yueh
catheter was introduced. Paracentesis was performed. The catheter
was removed and a dressing applied.

COMPLICATIONS:
None immediate
FINDINGS: A total of approximately 9 L of clear yellow fluid was removed. A
fluid sample was none sent for laboratory analysis.
IMPRESSION: Successful ultrasound guided paracentesis yielding 9 L of ascites.

## 2015-03-10 IMAGING — US US PARACENTESIS
1 series · 9 of 9 positions shown · non-contrast
Comparison: PREVIOUS PARACENTESIS 01/01/2013

CLINICAL DATA: Cirrhosis, recurrent ascites. Request is made for
therapeutic paracentesis up to 8 liters.

EXAM:
ULTRASOUND GUIDED THERAPEUTIC PARACENTESIS

[Series 1: us paracentesis · 0.29mm/px · 9 of 9 slices shown]
[im 1/9]
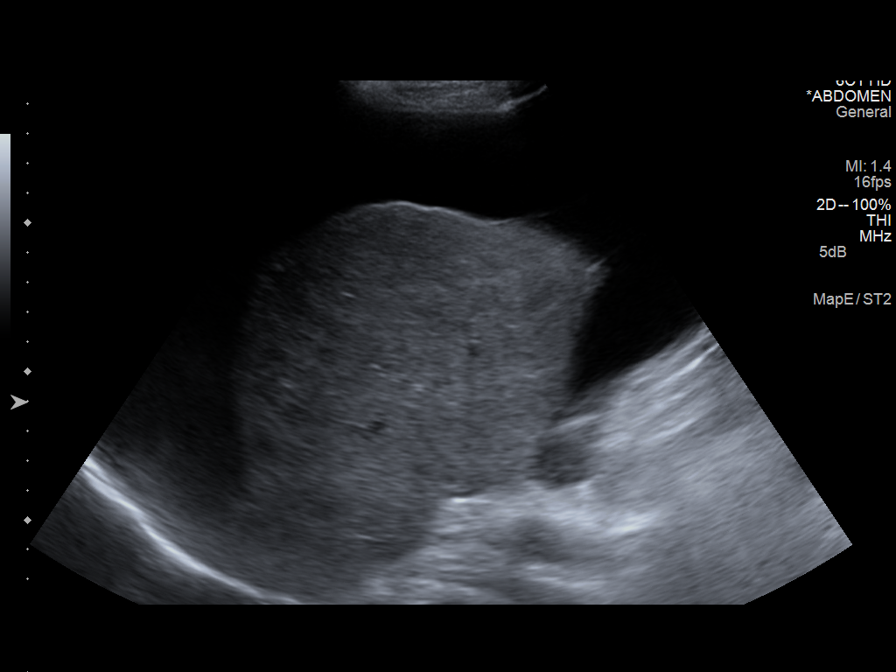
[im 2/9]
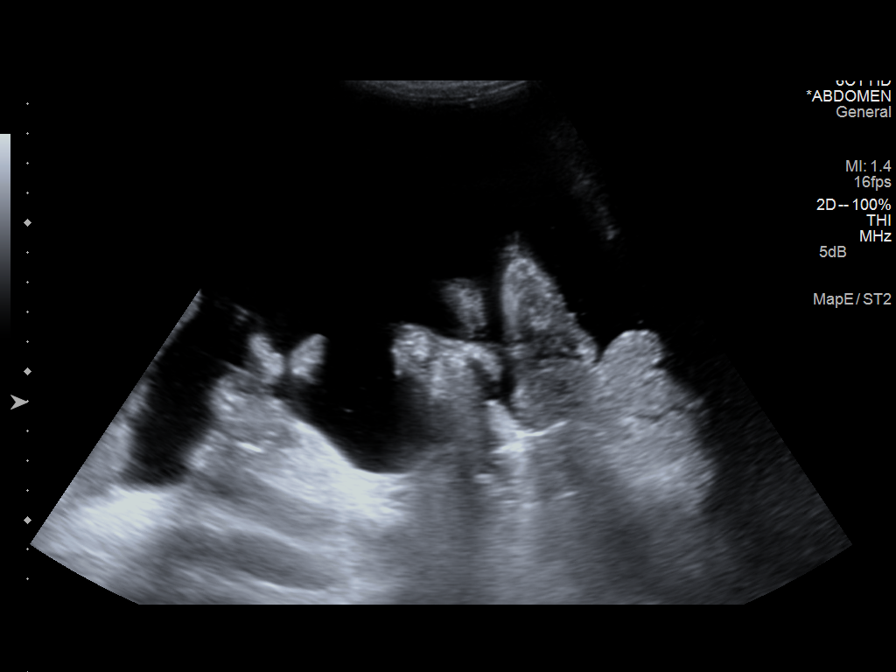
[im 3/9]
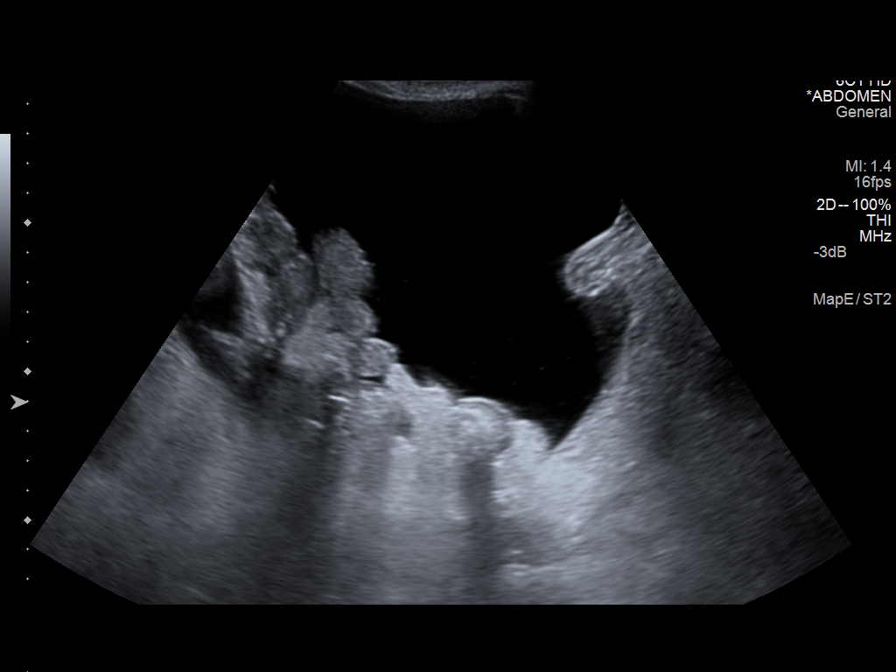
[im 4/9]
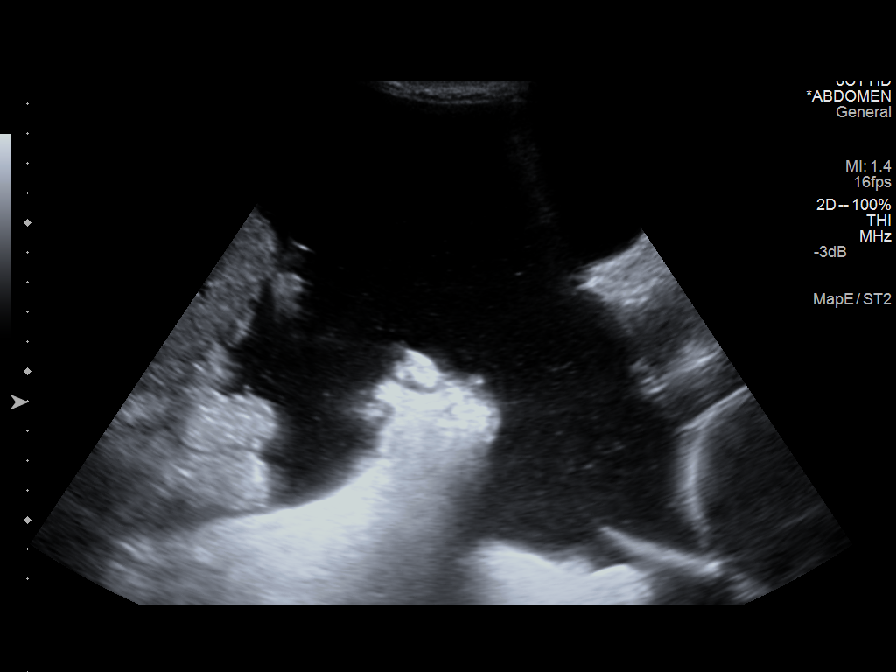
[im 5/9]
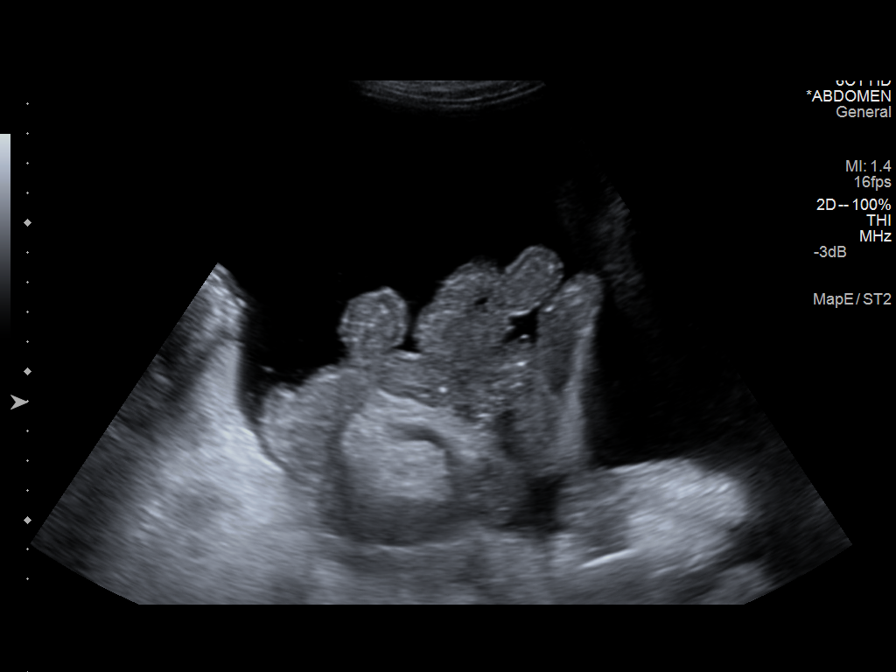
[im 6/9]
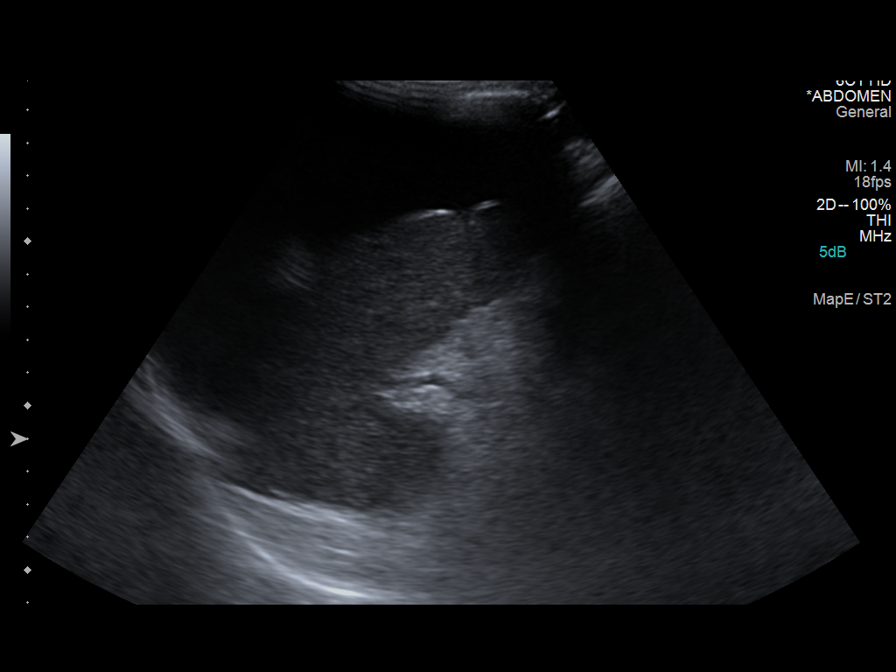
[im 7/9]
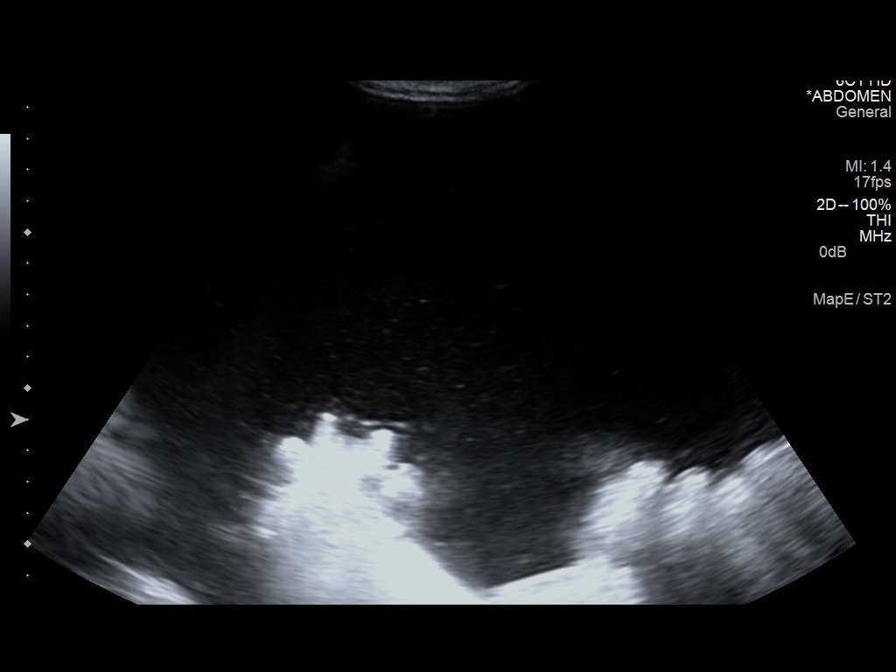
[im 8/9]
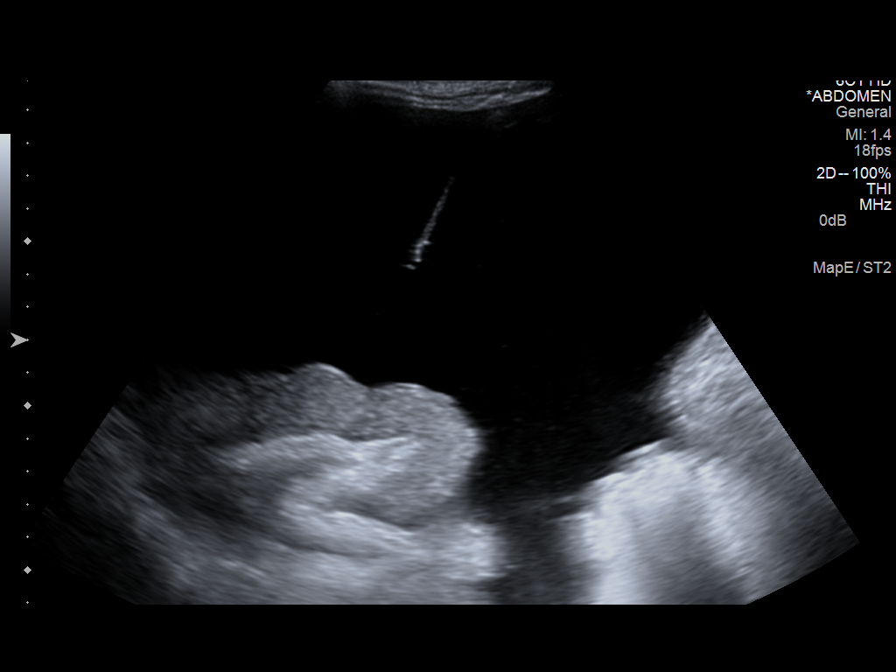
[im 9/9]
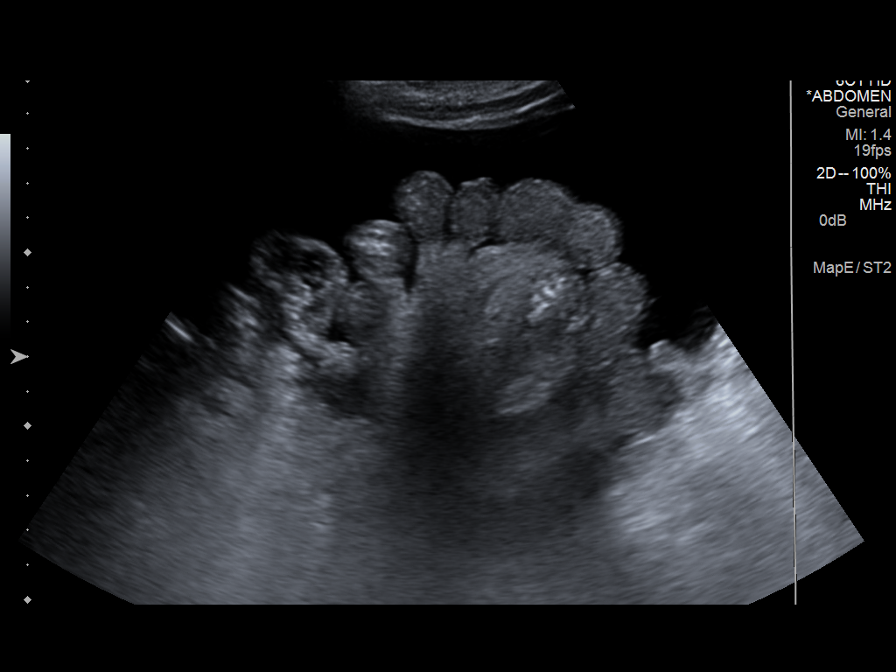

[9 of 9 positions shown; findings below may reference images not displayed]

PROCEDURE:
An ultrasound guided paracentesis was thoroughly discussed with the
patient and questions answered. The benefits, risks, alternatives
and complications were also discussed. The patient understands and
wishes to proceed with the procedure. Written consent was obtained.

Ultrasound was performed to localize and mark an adequate pocket of
fluid in the left lower... quadrant of the abdomen. The area was
then prepped and draped in the normal sterile fashion. 1% Lidocaine
was used for local anesthesia. Under ultrasound guidance a 19 gauge
Yueh catheter was introduced. Paracentesis was performed. The
catheter was removed and a dressing applied.

COMPLICATIONS:
None immediate.
FINDINGS: A total of approximately 8 liters of slightly turbid, yellow fluid
was removed.
IMPRESSION: Successful ultrasound guided therapeutic paracentesis yielding 8
liters of ascites. The patient received IV albumin pre procedure.

## 2018-02-02 ENCOUNTER — Telehealth: Payer: Self-pay | Admitting: Cardiology

## 2018-02-02 NOTE — Telephone Encounter (Signed)
Did not need this encounter °
